# Patient Record
Sex: Male | Born: 1995 | Race: Black or African American | Hispanic: No | Marital: Married | Smoking: Former smoker
Health system: Southern US, Community
[De-identification: ages and names within clinical notes are randomized; demographics above are authoritative.]

## PROBLEM LIST (undated history)

## (undated) DIAGNOSIS — I10 Essential (primary) hypertension: Secondary | ICD-10-CM

## (undated) DIAGNOSIS — E119 Type 2 diabetes mellitus without complications: Secondary | ICD-10-CM

## (undated) HISTORY — PX: BACK SURGERY: SHX140

## (undated) HISTORY — PX: ADENOIDECTOMY: SUR15

---

## 2014-07-04 DIAGNOSIS — E119 Type 2 diabetes mellitus without complications: Secondary | ICD-10-CM | POA: Insufficient documentation

## 2014-07-04 DIAGNOSIS — Z720 Tobacco use: Secondary | ICD-10-CM | POA: Insufficient documentation

## 2014-07-04 DIAGNOSIS — Z794 Long term (current) use of insulin: Secondary | ICD-10-CM

## 2014-07-04 DIAGNOSIS — IMO0002 Reserved for concepts with insufficient information to code with codable children: Secondary | ICD-10-CM | POA: Insufficient documentation

## 2014-07-04 DIAGNOSIS — E1165 Type 2 diabetes mellitus with hyperglycemia: Secondary | ICD-10-CM | POA: Insufficient documentation

## 2014-07-04 DIAGNOSIS — Z6835 Body mass index (BMI) 35.0-35.9, adult: Secondary | ICD-10-CM

## 2015-08-20 LAB — HM DIABETES EYE EXAM

## 2015-10-12 ENCOUNTER — Ambulatory Visit: Payer: Self-pay | Admitting: Internal Medicine

## 2015-10-12 DIAGNOSIS — Z0289 Encounter for other administrative examinations: Secondary | ICD-10-CM

## 2016-01-28 ENCOUNTER — Ambulatory Visit: Payer: Self-pay | Admitting: Primary Care

## 2016-01-28 DIAGNOSIS — Z0289 Encounter for other administrative examinations: Secondary | ICD-10-CM

## 2016-03-27 ENCOUNTER — Encounter (HOSPITAL_COMMUNITY): Payer: Self-pay | Admitting: Emergency Medicine

## 2016-03-27 ENCOUNTER — Emergency Department (HOSPITAL_COMMUNITY)
Admission: EM | Admit: 2016-03-27 | Discharge: 2016-03-27 | Disposition: A | Discharge status: Home / Self Care | Payer: BLUE CROSS/BLUE SHIELD | Attending: Emergency Medicine | Admitting: Emergency Medicine

## 2016-03-27 DIAGNOSIS — F17210 Nicotine dependence, cigarettes, uncomplicated: Secondary | ICD-10-CM | POA: Insufficient documentation

## 2016-03-27 DIAGNOSIS — I10 Essential (primary) hypertension: Secondary | ICD-10-CM | POA: Insufficient documentation

## 2016-03-27 DIAGNOSIS — Z794 Long term (current) use of insulin: Secondary | ICD-10-CM | POA: Insufficient documentation

## 2016-03-27 DIAGNOSIS — E119 Type 2 diabetes mellitus without complications: Secondary | ICD-10-CM | POA: Insufficient documentation

## 2016-03-27 DIAGNOSIS — M5442 Lumbago with sciatica, left side: Secondary | ICD-10-CM | POA: Insufficient documentation

## 2016-03-27 DIAGNOSIS — M545 Low back pain: Secondary | ICD-10-CM | POA: Diagnosis present

## 2016-03-27 HISTORY — DX: Essential (primary) hypertension: I10

## 2016-03-27 HISTORY — DX: Type 2 diabetes mellitus without complications: E11.9

## 2016-03-27 MED ORDER — OXYCODONE-ACETAMINOPHEN 5-325 MG PO TABS
1.0000 | ORAL_TABLET | Freq: Once | ORAL | Status: AC
  Administered 2016-03-27: 1 via ORAL
  Filled 2016-03-27: qty 1

## 2016-03-27 MED ORDER — OXYCODONE-ACETAMINOPHEN 5-325 MG PO TABS: 2.0000 | ORAL_TABLET | ORAL | 0 refills | Status: DC | PRN

## 2016-03-27 MED ORDER — PREGABALIN 75 MG PO CAPS: 75.0000 mg | ORAL_CAPSULE | Freq: Two times a day (BID) | ORAL | 0 refills | Status: DC

## 2016-03-27 NOTE — ED Provider Notes (Signed)
[facility]EMER[facility]ENCY DEPT Provider Note   CSN: [id number] Arrival date & time: 03/27/16  0556     History   Chief Complaint Chief Complaint  Patient presents with  . Back Pain    HPI Cody Ruiz is a 20 y.o. male.  HPI   [age] YOM presents today with complaints of back pain. Pt reports a history of the same resulting in discectomy. PT notes approximately 3 weeks ago he started to develop lower back pain, worse on the right. Pt notes the pain radiates down his left leg with minor associated intermittent tingling in his toes. He reports standing straight or sitting makes symptoms worse. Pt has been seen by [facility], he had an MRI of his lower spine showing nerve root impingement. He has been referred to [facility] [facility] where he'll see Dr. Yetta BarreJones on 04/07/2016 ( 11 days from now). Patient notes he has been taking oxycodone at home which provides very minimal relief, notes that he did receive a sample of Lyrica while at the orthopedic office which significantly improved his symptoms. Patient denies any red flags for back pain, denies any drug use, reports this is similar to previous, pain is worsening, no other concerning signs or symptoms here today.   Past Medical History:  Diagnosis Date  . Diabetes mellitus without complication (HCC)   . Hypertension     There are no active problems to display for this patient.   Past Surgical History:  Procedure Laterality Date  . ADENOIDECTOMY    . BACK SURGERY         Home Medications    Prior to Admission medications   Medication Sig Start Date End Date Taking? Authorizing Provider  acetaminophen (TYLENOL) 500 MG tablet Take 1,500 mg by mouth every 6 (six) hours as needed for mild pain.   Yes Historical Provider, MD  amLODipine-benazepril (LOTREL) 10-40 MG capsule Take 1 capsule by mouth daily.   Yes Historical Provider, MD  carvedilol (COREG) 12.5 MG tablet Take 12.5 mg by mouth 2 (two) times daily with a meal.    Yes Historical Provider, MD  insulin detemir (LEVEMIR) 100 unit/ml SOLN Inject 50 Units into the skin at bedtime.   Yes Historical Provider, MD  insulin lispro (HUMALOG KWIKPEN) 100 UNIT/ML KiwkPen Inject 10 Units into the skin 3 (three) times daily with meals.   Yes Historical Provider, MD  oxyCODONE-acetaminophen (PERCOCET/ROXICET) 5-325 MG tablet Take 2 tablets by mouth every 4 (four) hours as needed for severe pain. 03/27/16   Eyvonne MechanicJeffrey Ajmal Kathan, PA-C  pregabalin (LYRICA) 75 MG capsule Take 1 capsule (75 mg total) by mouth 2 (two) times daily. 03/27/16   Eyvonne MechanicJeffrey Pradeep Beaubrun, PA-C    Family History Family History  Problem Relation Age of Onset  . Hypertension Mother   . Diabetes Mother   . Hypertension Father     Social History Social History  Substance Use Topics  . Smoking status: Current Every Day Smoker    Types: Cigarettes  . Smokeless tobacco: Never Used  . Alcohol use No     Allergies   Patient has no known allergies.   Review of Systems Review of Systems  All other systems reviewed and are negative.    Physical Exam Updated Vital Signs BP (!) 184/108 (BP Location: Right Arm)   Pulse 70   Temp 98 F (36.7 C) (Oral)   Resp 18   Ht 5\' 11"  (1.803 m)   Wt 122.5 kg   SpO2 97%   BMI 37.66 kg/m  Physical Exam  Constitutional: He is oriented to person, place, and time. He appears well-developed and well-nourished. No distress.  HENT:  Head: Normocephalic and atraumatic.  Eyes: Conjunctivae are normal. Pupils are equal, round, and reactive to light. Right eye exhibits no discharge. Left eye exhibits no discharge. No scleral icterus.  Neck: Normal range of motion. Neck supple. No JVD present. No tracheal deviation present.  Pulmonary/Chest: Effort normal. No stridor.  Musculoskeletal: Normal range of motion. He exhibits tenderness. He exhibits no edema.  No C, T, or L spine tenderness to palpation. No obvious signs of trauma, deformity, infection, step-offs. Previous  midline lumbar surgical scare noted. Lung expansion normal. No scoliosis or kyphosis. Bilateral lower extremity strength 5 out of 5, sensation grossly intact, patellar reflexes absent ( normal per patient)   Straight leg postive Ambulates without difficulty    Neurological: He is alert and oriented to person, place, and time. Coordination normal.  Skin: Skin is warm and dry. He is not diaphoretic.  Psychiatric: He has a normal mood and affect. His behavior is normal. Judgment and thought content normal.  Nursing note and vitals reviewed.    ED Treatments / Results  Labs (all labs ordered are listed, but only abnormal results are displayed) Labs Reviewed - No data to display  EKG  EKG Interpretation None       Radiology No results found.  Procedures Procedures (including critical care time)  Medications Ordered in ED Medications  oxyCODONE-acetaminophen (PERCOCET/ROXICET) 5-325 MG per tablet 1 tablet (not administered)     Initial Impression / Assessment and Plan / ED Course  I have reviewed the triage vital signs and the nursing notes.  Pertinent labs & imaging results that were available during my care of the patient were reviewed by me and considered in my medical decision making (see chart for details).  Clinical Course      Final Clinical Impressions(s) / ED Diagnoses   Final diagnoses:  Acute right-sided low back pain with left-sided sciatica   Labs:  Imaging:  Consults:  Therapeutics:  Discharge Meds: percocet, lyrica   Assessment/Plan:  20 year old male presents today with back pain. Patient has recent MRI, closely followed by orthopedics. He has no significant change in his back pain, does have slightly worsening pain.(Similar to previous back pain, he has an appointment with neurosurgery. He has no red flags, no need for repeat imaging here in the ED. Patient reports he has taken Lyrica and notes this this is the most relieving medication he has  been given. Truck reporting database reviewed, patient has had short course of narcotics recently, he was forthcoming with this information. I will give patient short course of Percocet, Lyrica, and encouraged him to follow up with neurosurgery soon as possible, or return to orthopedics for reassessment. Patient is given strict return precautions, both him and his wife verbalized understanding and agreement to today's plan had no further questions or concerns.      New Prescriptions New Prescriptions   OXYCODONE-ACETAMINOPHEN (PERCOCET/ROXICET) 5-325 MG TABLET    Take 2 tablets by mouth every 4 (four) hours as needed for severe pain.   PREGABALIN (LYRICA) 75 MG CAPSULE    Take 1 capsule (75 mg total) by mouth 2 (two) times daily.     Eyvonne MechanicJeffrey Amunique Neyra, PA-C 03/27/16 [id number]    Dione Boozeavid Glick, MD 03/27/16 2251

## 2016-03-27 NOTE — ED Triage Notes (Signed)
Pt come from home stating he is having increasing pain in his low back an din to the right leg. Pt states he his currently scheduled to have an appointment with a neurosurgeon on 12/18.

## 2016-03-27 NOTE — Discharge Instructions (Signed)
Please read attached information. If you experience any new or worsening signs or symptoms please return to the emergency room for evaluation. Please follow-up with your primary care provider or specialist as discussed. Please use medication prescribed only as directed and discontinue taking if you have any concerning signs or symptoms.   °

## 2016-03-31 ENCOUNTER — Other Ambulatory Visit: Payer: Self-pay | Admitting: Neurological Surgery

## 2016-03-31 DIAGNOSIS — M5126 Other intervertebral disc displacement, lumbar region: Secondary | ICD-10-CM

## 2016-04-07 ENCOUNTER — Ambulatory Visit
Admission: RE | Admit: 2016-04-07 | Discharge: 2016-04-07 | Disposition: A | Discharge status: Home / Self Care | Payer: BLUE CROSS/BLUE SHIELD | Source: Ambulatory Visit | Attending: Neurological Surgery | Admitting: Neurological Surgery

## 2016-04-07 ENCOUNTER — Other Ambulatory Visit: Payer: Self-pay | Admitting: Neurological Surgery

## 2016-04-07 DIAGNOSIS — M5126 Other intervertebral disc displacement, lumbar region: Secondary | ICD-10-CM

## 2016-04-07 MED ORDER — IOPAMIDOL (ISOVUE-M 200) INJECTION 41%
1.0000 mL | Freq: Once | INTRAMUSCULAR | Status: AC
  Administered 2016-04-07: 1 mL via EPIDURAL

## 2016-04-07 MED ORDER — METHYLPREDNISOLONE ACETATE 40 MG/ML INJ SUSP (RADIOLOG
120.0000 mg | Freq: Once | INTRAMUSCULAR | Status: AC
  Administered 2016-04-07: 120 mg via EPIDURAL

## 2016-04-07 NOTE — Discharge Instructions (Signed)

## 2016-04-09 ENCOUNTER — Other Ambulatory Visit: Payer: BLUE CROSS/BLUE SHIELD

## 2016-05-13 ENCOUNTER — Encounter: Payer: Self-pay | Admitting: Internal Medicine

## 2016-05-13 ENCOUNTER — Ambulatory Visit (INDEPENDENT_AMBULATORY_CARE_PROVIDER_SITE_OTHER): Payer: 59 | Admitting: Internal Medicine

## 2016-05-13 VITALS — BP 158/100 | HR 87 | Temp 98.6°F | Ht 70.5 in | Wt 257.5 lb

## 2016-05-13 DIAGNOSIS — E1065 Type 1 diabetes mellitus with hyperglycemia: Secondary | ICD-10-CM | POA: Insufficient documentation

## 2016-05-13 DIAGNOSIS — Z9189 Other specified personal risk factors, not elsewhere classified: Secondary | ICD-10-CM

## 2016-05-13 DIAGNOSIS — G8929 Other chronic pain: Secondary | ICD-10-CM | POA: Diagnosis not present

## 2016-05-13 DIAGNOSIS — I10 Essential (primary) hypertension: Secondary | ICD-10-CM

## 2016-05-13 DIAGNOSIS — R41840 Attention and concentration deficit: Secondary | ICD-10-CM | POA: Diagnosis not present

## 2016-05-13 DIAGNOSIS — M5441 Lumbago with sciatica, right side: Secondary | ICD-10-CM | POA: Diagnosis not present

## 2016-05-13 LAB — COMPREHENSIVE METABOLIC PANEL
ALBUMIN: 4.4 g/dL (ref 3.5–5.2)
ALK PHOS: 100 U/L (ref 39–117)
ALT: 31 U/L (ref 0–53)
AST: 21 U/L (ref 0–37)
BILIRUBIN TOTAL: 0.4 mg/dL (ref 0.2–1.2)
BUN: 13 mg/dL (ref 6–23)
CO2: 32 mEq/L (ref 19–32)
CREATININE: 0.94 mg/dL (ref 0.40–1.50)
Calcium: 9.8 mg/dL (ref 8.4–10.5)
Chloride: 101 mEq/L (ref 96–112)
GFR: 128.11 mL/min (ref >60.00)
Glucose, Bld: 156 mg/dL — ABNORMAL HIGH (ref 70–99)
Potassium: 4.2 mEq/L (ref 3.5–5.1)
Sodium: 136 mEq/L (ref 135–145)
TOTAL PROTEIN: 8 g/dL (ref 6.0–8.3)

## 2016-05-13 LAB — LIPID PANEL
CHOLESTEROL: 206 mg/dL — AB (ref 0–200)
HDL: 43.2 mg/dL (ref >39.00)
NONHDL: 162.89
Total CHOL/HDL Ratio: 5
Triglycerides: 217 mg/dL — ABNORMAL HIGH (ref 0.0–149.0)
VLDL: 43.4 mg/dL — ABNORMAL HIGH (ref 0.0–40.0)

## 2016-05-13 LAB — CBC
HCT: 41.8 % (ref 39.0–52.0)
HEMOGLOBIN: 13.6 g/dL (ref 13.0–17.0)
MCHC: 32.4 g/dL (ref 30.0–36.0)
MCV: 85.2 fl (ref 78.0–100.0)
PLATELETS: 364 10*3/uL (ref 150.0–400.0)
RBC: 4.9 Mil/uL (ref 4.22–5.81)
RDW: 15.2 % (ref 11.5–15.5)
WBC: 13.4 10*3/uL — AB (ref 4.0–10.5)

## 2016-05-13 LAB — LDL CHOLESTEROL, DIRECT: LDL DIRECT: 110 mg/dL

## 2016-05-13 LAB — HEMOGLOBIN A1C: Hgb A1c MFr Bld: 10.2 % — ABNORMAL HIGH (ref 4.6–6.5)

## 2016-05-13 NOTE — Assessment & Plan Note (Signed)
Uncontrolled CMET today If kidney function normal, will refill Amlodipine-Benazepril Will hold off on Coreg at this time  RTC in 3 weeks for follow up HTN

## 2016-05-13 NOTE — Assessment & Plan Note (Signed)
He will continue Meloxicam, Robaxin and Gabapentin He will continue to follow with neurosurgery

## 2016-05-13 NOTE — Assessment & Plan Note (Signed)
Uncontrolled A1C, CMET and Lipid profile today Encouraged him to consume a low carb, low fat diet and exercise for weight loss Will send him in a meter, strips and lancets After labs are back, will restart Levemir and Humalog Foot exam today Encouraged him to continue yearly eye exams He declines flu and pneumovax

## 2016-05-13 NOTE — Patient Instructions (Signed)
Diabetes Mellitus and Food It is important for you to manage your blood sugar (glucose) level. Your blood glucose level can be greatly affected by what you eat. Eating healthier foods in the appropriate amounts throughout the day at about the same time each day will help you control your blood glucose level. It can also help slow or prevent worsening of your diabetes mellitus. Healthy eating may even help you improve the level of your blood pressure and reach or maintain a healthy weight. General recommendations for healthful eating and cooking habits include:  Eating meals and snacks regularly. Avoid going long periods of time without eating to lose weight.  Eating a diet that consists mainly of plant-based foods, such as fruits, vegetables, nuts, legumes, and whole grains.  Using low-heat cooking methods, such as baking, instead of high-heat cooking methods, such as deep frying.  Work with your dietitian to make sure you understand how to use the Nutrition Facts information on food labels. How can food affect me? Carbohydrates Carbohydrates affect your blood glucose level more than any other type of food. Your dietitian will help you determine how many carbohydrates to eat at each meal and teach you how to count carbohydrates. Counting carbohydrates is important to keep your blood glucose at a healthy level, especially if you are using insulin or taking certain medicines for diabetes mellitus. Alcohol Alcohol can cause sudden decreases in blood glucose (hypoglycemia), especially if you use insulin or take certain medicines for diabetes mellitus. Hypoglycemia can be a life-threatening condition. Symptoms of hypoglycemia (sleepiness, dizziness, and disorientation) are similar to symptoms of having too much alcohol. If your health care provider has given you approval to drink alcohol, do so in moderation and use the following guidelines:  Women should not have more than one drink per day, and men  should not have more than two drinks per day. One drink is equal to: ? 12 oz of beer. ? 5 oz of wine. ? 1 oz of hard liquor.  Do not drink on an empty stomach.  Keep yourself hydrated. Have water, diet soda, or unsweetened iced tea.  Regular soda, juice, and other mixers might contain a lot of carbohydrates and should be counted.  What foods are not recommended? As you make food choices, it is important to remember that all foods are not the same. Some foods have fewer nutrients per serving than other foods, even though they might have the same number of calories or carbohydrates. It is difficult to get your body what it needs when you eat foods with fewer nutrients. Examples of foods that you should avoid that are high in calories and carbohydrates but low in nutrients include:  Trans fats (most processed foods list trans fats on the Nutrition Facts label).  Regular soda.  Juice.  Candy.  Sweets, such as cake, pie, doughnuts, and cookies.  Fried foods.  What foods can I eat? Eat nutrient-rich foods, which will nourish your body and keep you healthy. The food you should eat also will depend on several factors, including:  The calories you need.  The medicines you take.  Your weight.  Your blood glucose level.  Your blood pressure level.  Your cholesterol level.  You should eat a variety of foods, including:  Protein. ? Lean cuts of meat. ? Proteins low in saturated fats, such as fish, egg whites, and beans. Avoid processed meats.  Fruits and vegetables. ? Fruits and vegetables that may help control blood glucose levels, such as apples,   mangoes, and yams.  Dairy products. ? Choose fat-free or low-fat dairy products, such as milk, yogurt, and cheese.  Grains, bread, pasta, and rice. ? Choose whole grain products, such as multigrain bread, whole oats, and brown rice. These foods may help control blood pressure.  Fats. ? Foods containing healthful fats, such as  nuts, avocado, olive oil, canola oil, and fish.  Does everyone with diabetes mellitus have the same meal plan? Because every person with diabetes mellitus is different, there is not one meal plan that works for everyone. It is very important that you meet with a dietitian who will help you create a meal plan that is just right for you. This information is not intended to replace advice given to you by your health care provider. Make sure you discuss any questions you have with your health care provider. Document Released: 01/02/2005 Document Revised: 09/13/2015 Document Reviewed: 03/04/2013 Elsevier Interactive Patient Education  2017 [employer] Inc.  

## 2016-05-13 NOTE — Progress Notes (Signed)
HPI  Pt presents to the clinic today to establish care and for management of the conditions listed below. He is [place] from [place] in [place].  HTN: His BP today is 158/100. He was prescribed Amlodipine-Benazapril and Carvedilol in the past and reports that controlled his blood pressure. He has been out of his medications for a few months. He denies headaches, dizziness, chest pain or shortness of breath.   DM, type 1: He is not testing his sugars. He is currently not taking any insulin at this time. He was on Humalog and Levemir. He does not check his feet but he does get yearly eye exams. He declines flu and pneumonia vaccines.  Low Back Pain: This started 3 months ago, re injured an old back injury. He reports he already had had back surgery x 1 for a herniated disk. He describes the pain as constant achy pain, but can be sharp and stabbing with certain movements. He reports tingling down his right leg. He denies loss of bowel or bladder. He is taking Meloxicam, Neurontin and Robaxin with some relief. He is following with Neurosurgery.   He is also requesting referral to psychiatry at this time. He reports he lost his only son about 4 months ago. He denies anxiety or depression but feels like he is not grieving like he should. He is also concerned that he may have ADHD. He reports he can not remember anything. He has trouble focusing and no motivation to get things done. He has never been diagnosed with ADHD in the past.  Flu: never Tetanus: > 10 years ago Pneumovax: never Vision Screening: annually, 08/2015 Dentist: biannually  Past Medical History:  Diagnosis Date  . Diabetes mellitus without complication (HCC)   . Hypertension     Current Outpatient Prescriptions  Medication Sig Dispense Refill  . gabapentin (NEURONTIN) 300 MG capsule Take 600 mg by mouth 3 (three) times daily.     . meloxicam (MOBIC) 15 MG tablet     . methocarbamol (ROBAXIN) 750 MG tablet Take  750 mg by mouth 3 (three) times daily.     Marland Kitchen. acetaminophen (TYLENOL) 500 MG tablet Take 1,500 mg by mouth every 6 (six) hours as needed for mild pain.    Marland Kitchen. amLODipine-benazepril (LOTREL) 10-40 MG capsule Take 1 capsule by mouth daily.    . carvedilol (COREG) 12.5 MG tablet Take 12.5 mg by mouth 2 (two) times daily with a meal.    . insulin detemir (LEVEMIR) 100 unit/ml SOLN Inject 50 Units into the skin at bedtime.    . insulin lispro (HUMALOG KWIKPEN) 100 UNIT/ML KiwkPen Inject 10 Units into the skin 3 (three) times daily with meals.     No current facility-administered medications for this visit.     No Known Allergies  Family History  Problem Relation Age of Onset  . Hypertension Mother   . Diabetes Mother   . Hypertension Father   . Diabetes Paternal Grandmother     Social History   Social History  . Marital status: Married    Spouse name: N/A  . Number of children: N/A  . Years of education: N/A   Occupational History  . Not on file.   Social History Main Topics  . Smoking status: Current Every Day Smoker    Types: Cigars  . Smokeless tobacco: Never Used     Comment: cigar-1 per day  . Alcohol use No  . Drug use: Yes    Frequency: 7.0 times  per week    Types: Marijuana  . Sexual activity: Yes   Other Topics Concern  . Not on file   Social History Narrative  . No narrative on file    ROS:  Constitutional: Denies fever, malaise, fatigue, headache or abrupt weight changes.  HEENT: Denies eye pain, eye redness, ear pain, ringing in the ears, wax buildup, runny nose, nasal congestion, bloody nose, or sore throat. Respiratory: Denies difficulty breathing, shortness of breath, cough or sputum production.   Cardiovascular: Denies chest pain, chest tightness, palpitations or swelling in the hands or feet.  Gastrointestinal: Denies abdominal pain, bloating, constipation, diarrhea or blood in the stool.  GU: Denies frequency, urgency, pain with urination, blood in  urine, odor or discharge. Musculoskeletal: Pt reports back pain. Denies decrease in range of motion, difficulty with gait, or joint swelling.  Skin: Denies redness, rashes, lesions or ulcercations.  Neurological: Pt reports tingling in right leg. Denies dizziness, difficulty with speech or problems with balance and coordination.  Psych: Pt reports stress. Denies anxiety, depression, SI/HI.  No other specific complaints in a complete review of systems (except as listed in HPI above).  PE:  BP (!) 158/100   Pulse 87   Temp 98.6 F (37 C) (Oral)   Ht 5' 10.5" (1.791 m)   Wt 257 lb 8 oz (116.8 kg)   SpO2 98%   BMI 36.43 kg/m  Wt Readings from Last 3 Encounters:  05/13/16 257 lb 8 oz (116.8 kg)  03/27/16 270 lb (122.5 kg)    General: Appears his stated age, obese in NAD. Skin: No ulcerations noted. HEENT: Head: normal shape and size; Eyes: sclera white, no icterus, conjunctiva pink, PERRLA and EOMs intact;  Cardiovascular: Normal rate and rhythm. S1,S2 noted.  No murmur, rubs or gallops noted.  Pulmonary/Chest: Normal effort and positive vesicular breath sounds. No respiratory distress. No wheezes, rales or ronchi noted.  Musculoskeletal: Pain with palpation over the lumbar spine. Strength 5/5 BUE/BLE. No difficulty with gait.  Neurological: Alert and oriented. Sensation intact to BLE. Psychiatric: Mood and affect normal. Behavior is normal. Judgment and thought content normal.   Assessment and Plan:  Grief after loss of son:  Advised him that he needs psychology for grief counseling not psychiatry He insists that he wants to see a psychiatrist- referral placed.  Inattention, trouble focusing, lack of motivation:  He is requesting ADHD testing Referral placed to psychiatry  RTC in 3 weeks to follow up HTN Robyn Galati, NP

## 2016-05-14 ENCOUNTER — Other Ambulatory Visit: Payer: Self-pay | Admitting: Internal Medicine

## 2016-05-14 MED ORDER — AMLODIPINE BESY-BENAZEPRIL HCL 10-40 MG PO CAPS: 1.0000 | ORAL_CAPSULE | Freq: Every day | ORAL | 0 refills | Status: DC

## 2016-05-14 MED ORDER — INSULIN LISPRO 100 UNIT/ML (KWIKPEN): 10.0000 [IU] | PEN_INJECTOR | Freq: Three times a day (TID) | SUBCUTANEOUS | 0 refills | Status: DC

## 2016-05-14 MED ORDER — ATORVASTATIN CALCIUM 10 MG PO TABS: 10.0000 mg | ORAL_TABLET | Freq: Every day | ORAL | 2 refills | Status: DC

## 2016-05-14 MED ORDER — LANCETS 30G MISC: 1.0000 | Freq: Three times a day (TID) | 2 refills | Status: DC

## 2016-05-14 MED ORDER — INSULIN DETEMIR 100 UNIT/ML FLEXPEN: 25.0000 [IU] | PEN_INJECTOR | Freq: Every day | SUBCUTANEOUS | 0 refills | Status: DC

## 2016-05-14 NOTE — Addendum Note (Signed)
Addended by: Roena MaladyEVONTENNO, Wynston Romey Y on: 05/14/2016 05:20 PM   Modules accepted: Orders

## 2016-05-16 ENCOUNTER — Other Ambulatory Visit: Payer: Self-pay

## 2016-05-16 MED ORDER — INSULIN ASPART 100 UNIT/ML FLEXPEN: 10.0000 [IU] | PEN_INJECTOR | Freq: Three times a day (TID) | SUBCUTANEOUS | 0 refills | Status: AC

## 2016-05-16 NOTE — Telephone Encounter (Signed)
Per [employer] insurance will not cover Humalog must change to [employer]---Regina Baity gave verbal ok to change---Novolog sent to pharmacy

## 2016-05-20 MED ORDER — EASY TOUCH LANCETS 30G MISC: 1.0000 | Freq: Three times a day (TID) | 2 refills | Status: DC

## 2016-05-20 NOTE — Addendum Note (Signed)
Addended by: Roena MaladyEVONTENNO, Kasmira Cacioppo Y on: 05/20/2016 04:22 PM   Modules accepted: Orders

## 2016-06-05 ENCOUNTER — Telehealth: Payer: Self-pay | Admitting: Internal Medicine

## 2016-06-05 ENCOUNTER — Ambulatory Visit (HOSPITAL_COMMUNITY): Payer: 59 | Admitting: Psychiatry

## 2016-06-05 ENCOUNTER — Ambulatory Visit: Payer: 59 | Admitting: Internal Medicine

## 2016-06-05 NOTE — Progress Notes (Deleted)
Subjective:    Patient ID: Cody Ruiz, male    DOB: [birth year], 21 y.o.   MRN: [id number]  HPI  Pt presents to the clinic today for 3 week follow up of HTN. His BP at his last visit was 158/100. He was restarted on Amlodipine-Benazapril 10-40 mg. He has been taking the medication as prescribed. His BP today is. There is no ECG on file.  Review of Systems      Past Medical History:  Diagnosis Date  . Diabetes mellitus without complication (HCC)   . Hypertension     Current Outpatient Prescriptions  Medication Sig Dispense Refill  . acetaminophen (TYLENOL) 500 MG tablet Take 1,500 mg by mouth every 6 (six) hours as needed for mild pain.    Marland Kitchen. amLODipine-benazepril (LOTREL) 10-40 MG capsule Take 1 capsule by mouth daily. 30 capsule 0  . atorvastatin (LIPITOR) 10 MG tablet Take 1 tablet (10 mg total) by mouth daily. 30 tablet 2  . carvedilol (COREG) 12.5 MG tablet Take 12.5 mg by mouth 2 (two) times daily with a meal.    . EASY TOUCH LANCETS 30G MISC 1 each by Does not apply route 3 (three) times daily. 300 each 2  . gabapentin (NEURONTIN) 300 MG capsule Take 600 mg by mouth 3 (three) times daily.     . insulin aspart (NOVOLOG) 100 UNIT/ML FlexPen Inject 10 Units into the skin 3 (three) times daily with meals. 15 mL 0  . Insulin Detemir (LEVEMIR) 100 UNIT/ML Pen Inject 25 Units into the skin daily at 10 pm. 15 mL 0  . insulin detemir (LEVEMIR) 100 unit/ml SOLN Inject 50 Units into the skin at bedtime.    . meloxicam (MOBIC) 15 MG tablet     . methocarbamol (ROBAXIN) 750 MG tablet Take 750 mg by mouth 3 (three) times daily.      No current facility-administered medications for this visit.     No Known Allergies  Family History  Problem Relation Age of Onset  . Hypertension Mother   . Diabetes Mother   . Hypertension Father   . Diabetes Paternal Grandmother     Social History   Social History  . Marital status: Married    Spouse name: N/A  . Number of children: N/A    . Years of education: N/A   Occupational History  . Not on file.   Social History Main Topics  . Smoking status: Current Every Day Smoker    Types: Cigars  . Smokeless tobacco: Never Used     Comment: cigar-1 per day  . Alcohol use No  . Drug use: Yes    Frequency: 7.0 times per week    Types: Marijuana  . Sexual activity: Yes   Other Topics Concern  . Not on file   Social History Narrative  . No narrative on file     Constitutional: Denies fever, malaise, fatigue, headache or abrupt weight changes.  HEENT: Denies eye pain, eye redness, ear pain, ringing in the ears, wax buildup, runny nose, nasal congestion, bloody nose, or sore throat. Respiratory: Denies difficulty breathing, shortness of breath, cough or sputum production.   Cardiovascular: Denies chest pain, chest tightness, palpitations or swelling in the hands or feet.  Gastrointestinal: Denies abdominal pain, bloating, constipation, diarrhea or blood in the stool.  GU: Denies urgency, frequency, pain with urination, burning sensation, blood in urine, odor or discharge. Musculoskeletal: Denies decrease in range of motion, difficulty with gait, muscle pain or joint pain and swelling.  Skin: Denies redness, rashes, lesions or ulcercations.  Neurological: Denies dizziness, difficulty with memory, difficulty with speech or problems with balance and coordination.  Psych: Denies anxiety, depression, SI/HI.  No other specific complaints in a complete review of systems (except as listed in HPI above).  Objective:   Physical Exam        Assessment & Plan:

## 2016-06-05 NOTE — Telephone Encounter (Signed)
Patient did not come in for their appointment today for 3 week bp follow up Please let me know if patient needs to be contacted immediately for follow up or no follow up needed.

## 2016-06-06 NOTE — Telephone Encounter (Signed)
Yes he needs to follow up ASAP

## 2016-06-10 ENCOUNTER — Encounter (HOSPITAL_COMMUNITY): Payer: Self-pay | Admitting: Psychiatry

## 2016-06-10 ENCOUNTER — Ambulatory Visit (INDEPENDENT_AMBULATORY_CARE_PROVIDER_SITE_OTHER): Payer: 59 | Admitting: Psychiatry

## 2016-06-10 VITALS — BP 130/70 | HR 68 | Ht 71.0 in | Wt 260.4 lb

## 2016-06-10 DIAGNOSIS — F1290 Cannabis use, unspecified, uncomplicated: Secondary | ICD-10-CM

## 2016-06-10 DIAGNOSIS — F17210 Nicotine dependence, cigarettes, uncomplicated: Secondary | ICD-10-CM

## 2016-06-10 DIAGNOSIS — Z794 Long term (current) use of insulin: Secondary | ICD-10-CM | POA: Diagnosis not present

## 2016-06-10 DIAGNOSIS — F331 Major depressive disorder, recurrent, moderate: Secondary | ICD-10-CM | POA: Diagnosis not present

## 2016-06-10 DIAGNOSIS — Z79899 Other long term (current) drug therapy: Secondary | ICD-10-CM

## 2016-06-10 MED ORDER — FLUOXETINE HCL 10 MG PO CAPS: 10.0000 mg | ORAL_CAPSULE | Freq: Every day | ORAL | 1 refills | Status: DC

## 2016-06-10 NOTE — Progress Notes (Signed)
Psychiatric Initial Adult Assessment   Patient Identification: Cody Ruiz MRN:  [id number] Date of Evaluation:  06/10/2016 Referral Source: self Chief Complaint:  depression Visit Diagnosis:    ICD-9-CM ICD-10-CM   1. Moderate episode of recurrent major depressive disorder (HCC) 296.32 F33.1 FLUoxetine (PROZAC) 10 MG capsule    History of Present Illness:  Patient is a 21 year old male with no significant psychiatric history, with medical history of type 1 diabetes, who presents today for psychiatric intake assessment. He shares that he has never seen a psychiatrist and is only briefly spoken with counselors and history.  He shares that he and his wife have been married for about a year, and last year lost their son when he was 742 weeks old. He died from trisomy 5522. Their son was able to be with them for about a week and a half at home, and he shares that his family and his wife's family were all there to make sure that their son was loved 100% of his life. He is choked up and tearful in describing the events.  He shares that they have had substantial grief and he has been raised to avoid his feelings much of his life, so it has been difficult for him to be able to think about and talk about his grief. He shares that he thinks about his son every day, and it "tears me apart". He denies any thoughts about death or dying, and wants to be able to get back to having his usual good cheerful mood, so that he can be a good husband. 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 He has a strong love of football and played sports throughout high school.  He shares that he has felt quite down and depressed, has trouble sleeping, is not interested in the things he used to like, has low energy, difficulty focusing, feels forgetful, has a poor appetite, and feels quite irritable with things that usually would not bother him.  He shares that before this  past year, he was generally cheerful and happy go lucky guy. He denies any significant history of mania or psychosis. He shares that he did struggle with a couple periods of depression in his late teenage years, and this was in the context of having a significant back injury which made him no longer able to play football, and then the subsequent surgery which was quite painful and had a lengthy recovery.  He denies any alcohol use. He does share that he smokes marijuana daily, and realizes that he has definitely been using more sense his son passed away. He does not want to use this as a "crutch" and wishes to receive medication treatment for his depression, so that he can feel better and cut down to discontinue his marijuana use. He names that marijuana is not only illegal, but he knows that it would get in the way of him being able to join school and be able to have enough focus and attention to do well.  We spent time discussing initiation of fluoxetine, the pathophysiology of depression and mechanism of SSRI, and the risks and benefits associated with fluoxetine, including the black box warning. Discussed a starting dose of 10 mg and increased to 20 mg in 3 days as tolerated. Patient is on board with this and agrees to follow up in 2 weeks.  Fill Date Product, Str, Form Qty Days Pt ID Prescriber Written RX# N/R* Pharm **  MED+ ---------- -------------------------------- ------ ---- --------- ---------- ---------- ------------ ----- --------- ------ 03/27/2016 LYRICA 75 MG CAPSULE 30.00 30 [id number] 03/27/2016 [id number] N [id number] 00.0 03/27/2016 OXYCODONE-ACETAMINOPHEN 5-325 6.00 1 [id number] 03/27/2016 [id number] N [id number] 45.0 03/21/2016 OXYCODONE-ACETAMINOPHEN 5-325 30.00 7 [id number] 03/21/2016 [id number] N [id number] 32.14 03/04/2016 TRAMADOL HCL 50 MG TABLET 40.00 10 [id number] 03/04/2016 [id number] N [id number] 20.0 02/27/2016 OXYCODONE-ACETAMINOPHEN 10-325 20.00 7  [id number] 02/27/2016 [id number] N [id number] 42.86 *N/R N=New R=Refill +MED Daily Prescribers for prescriptions listed ---------------------------------------------------------------------------------------------------------------------------------- [id number] Estell Harpin MD; MURPHY/WAINER Lucius Conn, [facility] [address] [place], [place] [address] [id number] Albertha Ghee S MD; [address], [place] [address] [id number] HEDGES, JEFFREY T (PA); [facility], [address] [address], [place] [address] Pharmacies that dispensed prescriptions listed ---------------------------------------------------------------------------------------------------------------------------------- [id number] [facility] DBA: [facility] [id number], [address] [place] [address], [id number]  [facility] DBA: [facility] [id number], [address] [place] [address], Patients that match search criteria ---------------------------------------------------------------------------------------------------------------------------------- [id number] [facility] Phill, DOB [birth year]; [address], [place] [address] [id number] Riera Penn, DOB [birth year]; [address], [place] [address] MED Summary This section displays cumulative MED values by unique recipient. The MED Max value is the maximum occurrence of cumulative MED sustained for any 3 consecutive days. This value is calculated based on prescriptions dispensed during the date range requested. ----------------------------------------------------------------------------------------------------------------------------------- 42.86 Renninger, Adrian; 1995/07/14; [address], [place] [address]  Associated Signs/Symptoms: Depression Symptoms:  depressed mood, anhedonia, insomnia, psychomotor  retardation, fatigue, feelings of worthlessness/guilt, difficulty concentrating, impaired memory, (Hypo) Manic Symptoms:  none Anxiety Symptoms:  none Psychotic Symptoms:  none PTSD Symptoms: Negative  Past Psychiatric History: No past hospitalizations or psychiatric treatment  Previous Psychotropic Medications: No   Substance Abuse History in the last 12 months:  Yes.    Consequences of Substance Abuse: Recognizes this has negatively affected mood, and may impair schooling abilities  Past Medical History:  Past Medical History:  Diagnosis Date  . Diabetes mellitus without complication (HCC)   . Hypertension     Past Surgical History:  Procedure Laterality Date  . ADENOIDECTOMY    . BACK SURGERY      Family Psychiatric History: Maternal history of depression  Family History:  Family History  Problem Relation Age of Onset  . Hypertension Mother   . Diabetes Mother   . Hypertension Father   . Diabetes Paternal Grandmother     Social History:   Social History   Social History  . Marital status: Married    Spouse name: N/A  . Number of children: N/A  . Years of education: N/A   Social History Main Topics  . Smoking status: Current Every Day Smoker    Types: Cigars  . Smokeless tobacco: Never Used     Comment: cigar-1 per day  . Alcohol use No  . Drug use: Yes    Frequency: 7.0 times per week    Types: Marijuana  . Sexual activity: Yes   Other Topics Concern  . Not on file   Social History Narrative  . No narrative on file    Additional Social History: married for nearly 1 year. Has 2 dogs. Him and his wife plan to try to have a baby again.  Allergies:  No Known Allergies  Metabolic Disorder Labs: Lab Results  Component Value Date   HGBA1C 10.2 (H) 05/13/2016   No results found for: PROLACTIN  Lab Results  Component Value Date   CHOL 206 (H) 05/13/2016   TRIG 217.0 (H) 05/13/2016   HDL 43.20 05/13/2016   CHOLHDL 5 05/13/2016   VLDL 43.4  (H) 05/13/2016     Current Medications: Current Outpatient Prescriptions  Medication Sig Dispense Refill  . acetaminophen (TYLENOL) 500 MG tablet Take 1,500 mg by mouth every 6 (six) hours as needed for mild pain.    Marland Kitchen amLODipine-benazepril (LOTREL) 10-40 MG capsule Take 1 capsule by mouth daily. 30 capsule 0  . atorvastatin (LIPITOR) 10 MG tablet Take 1 tablet (10 mg total) by mouth daily. 30 tablet 2  . carvedilol (COREG) 12.5 MG tablet Take 12.5 mg by mouth 2 (two) times daily with a meal.    . EASY TOUCH LANCETS 30G MISC 1 each by Does not apply route 3 (three) times daily. 300 each 2  . FLUoxetine (PROZAC) 10 MG capsule Take 1 capsule (10 mg total) by mouth daily. Take 10 mg daily for 3 days, then increase to 20 mg daily 60 capsule 1  . gabapentin (NEURONTIN) 300 MG capsule Take 600 mg by mouth 3 (three) times daily.     . insulin aspart (NOVOLOG) 100 UNIT/ML FlexPen Inject 10 Units into the skin 3 (three) times daily with meals. 15 mL 0  . Insulin Detemir (LEVEMIR) 100 UNIT/ML Pen Inject 25 Units into the skin daily at 10 pm. 15 mL 0  . insulin detemir (LEVEMIR) 100 unit/ml SOLN Inject 50 Units into the skin at bedtime.    . meloxicam (MOBIC) 15 MG tablet     . methocarbamol (ROBAXIN) 750 MG tablet Take 750 mg by mouth 3 (three) times daily.      No current facility-administered medications for this visit.     Neurologic: Headache: Negative Seizure: Negative Paresthesias:Negative  Musculoskeletal: Strength & Muscle Tone: within normal limits Gait & Station: normal Patient leans: N/A  Psychiatric Specialty Exam: ROS  There were no vitals taken for this visit.There is no height or weight on file to calculate BMI.  General Appearance: Casual and Well Groomed  Eye Contact:  Fair  Speech:  Clear and Coherent  Volume:  Normal  Mood:  Depressed  Affect:  Congruent, Depressed and Tearful  Thought Process:  Coherent and Goal Directed  Orientation:  Full (Time, Place, and  Person)  Thought Content:  Logical  Suicidal Thoughts:  No  Homicidal Thoughts:  No  Memory:  Immediate;   Good  Judgement:  Good  Insight:  Good  Psychomotor Activity:  Normal  Concentration:  Concentration: Good and Attention Span: Good  Recall:  NA  Fund of Knowledge:Good  Language: Good  Akathisia:  Negative  Handed:  Right  AIMS (if indicated):  n/a  Assets:  Communication Skills Desire for Improvement Financial Resources/Insurance Housing Intimacy Leisure Time Resilience Social Support Talents/Skills Transportation Vocational/Educational  ADL's:  Intact  Cognition: WNL  Sleep:  2 hours off and on throughout the night, generally 6-8 hours total, and naps in the day    Treatment Plan Summary: Mr. Favian Kittleson is a 21 year old man with a adequate history of uncontrolled type 1 diabetes (late adolescent onset) who presents today for a psychiatric intake. On assessment he presents with significant depressive symptoms in the context of the death of his 47-week-old child last year. He has struggled with substantial grief and feels that his depressed mood has negatively affected his relationship with his wife, his work function, and his sense of self.  Spent today learning  about the patient's upbringing, and his generally avoidant "tough it out" mentality that he learned through childhood. Spent time with the patient grieving this significant loss. We discussed his history concerning for major depressive disorder, and reviewed the pathophysiology of MDD, and the medication treatment options. He does not present with any acute safety issues.  # MDD, griefl - Initiate Fluoxetine 10 mg x 3 days, then increase to 20 mg daily - RTC in 2 weeks - Will continue to provide support and discuss additional supports for grief - Patient intends to reach out to the hospice services that took care of his son, as they offer free grief counseling for him and his wife - Will continue to  encourage self-care and behavioral activation  Burnard Leigh, MD 2/20/201810:53 AM

## 2016-06-20 ENCOUNTER — Encounter: Payer: Self-pay | Admitting: Internal Medicine

## 2016-06-20 NOTE — Telephone Encounter (Signed)
Sent letter to patient.

## 2016-06-24 ENCOUNTER — Ambulatory Visit (HOSPITAL_COMMUNITY): Payer: Self-pay | Admitting: Psychiatry

## 2016-06-24 NOTE — Progress Notes (Deleted)
BH MD/PA/NP OP Progress Note  06/24/2016 7:49 AM Cody Ruiz  MRN:  [id number]  Chief Complaint:  Subjective:  *** HPI: *** Visit Diagnosis: No diagnosis found.  Past Psychiatric History: ***  Past Medical History:  Past Medical History:  Diagnosis Date  . Diabetes mellitus without complication (HCC)   . Hypertension     Past Surgical History:  Procedure Laterality Date  . ADENOIDECTOMY    . BACK SURGERY      Family Psychiatric History: ***  Family History:  Family History  Problem Relation Age of Onset  . Hypertension Mother   . Diabetes Mother   . Hypertension Father   . Diabetes Paternal Grandmother     Social History:  Social History   Social History  . Marital status: Married    Spouse name: N/A  . Number of children: N/A  . Years of education: N/A   Social History Main Topics  . Smoking status: Current Every Day Smoker    Types: Cigars  . Smokeless tobacco: Never Used     Comment: cigar-1 per day  . Alcohol use No  . Drug use: Yes    Frequency: 7.0 times per week    Types: Marijuana  . Sexual activity: Yes   Other Topics Concern  . Not on file   Social History Narrative  . No narrative on file    Allergies: No Known Allergies  Metabolic Disorder Labs: Lab Results  Component Value Date   HGBA1C 10.2 (H) 05/13/2016   No results found for: PROLACTIN Lab Results  Component Value Date   CHOL 206 (H) 05/13/2016   TRIG 217.0 (H) 05/13/2016   HDL 43.20 05/13/2016   CHOLHDL 5 05/13/2016   VLDL 43.4 (H) 05/13/2016     Current Medications: Current Outpatient Prescriptions  Medication Sig Dispense Refill  . acetaminophen (TYLENOL) 500 MG tablet Take 1,500 mg by mouth every 6 (six) hours as needed for mild pain.    Marland Kitchen amLODipine-benazepril (LOTREL) 10-40 MG capsule Take 1 capsule by mouth daily. 30 capsule 0  . atorvastatin (LIPITOR) 10 MG tablet Take 1 tablet (10 mg total) by mouth daily. 30 tablet 2  . carvedilol (COREG) 12.5 MG tablet  Take 12.5 mg by mouth 2 (two) times daily with a meal.    . EASY TOUCH LANCETS 30G MISC 1 each by Does not apply route 3 (three) times daily. 300 each 2  . FLUoxetine (PROZAC) 10 MG capsule Take 1 capsule (10 mg total) by mouth daily. Take 10 mg daily for 3 days, then increase to 20 mg daily 60 capsule 1  . gabapentin (NEURONTIN) 300 MG capsule Take 600 mg by mouth 3 (three) times daily.     . insulin aspart (NOVOLOG) 100 UNIT/ML FlexPen Inject 10 Units into the skin 3 (three) times daily with meals. 15 mL 0  . Insulin Detemir (LEVEMIR) 100 UNIT/ML Pen Inject 25 Units into the skin daily at 10 pm. 15 mL 0  . insulin detemir (LEVEMIR) 100 unit/ml SOLN Inject 50 Units into the skin at bedtime.    . meloxicam (MOBIC) 15 MG tablet     . methocarbamol (ROBAXIN) 750 MG tablet Take 750 mg by mouth 3 (three) times daily.      No current facility-administered medications for this visit.     Neurologic: Headache: {BHH YES OR NO:22294} Seizure: {BHH YES OR NO:22294} Paresthesias: {BHH YES OR NO:22294}  Musculoskeletal: Strength & Muscle Tone: {desc; muscle tone:32375} Gait & Station: {PE GAIT ED  ZOXW:96045}ATL:22525} Patient leans: {Patient Leans:21022755}  Psychiatric Specialty Exam: ROS  There were no vitals taken for this visit.There is no height or weight on file to calculate BMI.  General Appearance: {Appearance:22683}  Eye Contact:  {BHH EYE CONTACT:22684}  Speech:  {Speech:22685}  Volume:  {Volume (PAA):22686}  Mood:  {BHH MOOD:22306}  Affect:  {Affect (PAA):22687}  Thought Process:  {Thought Process (PAA):22688}  Orientation:  {BHH ORIENTATION (PAA):22689}  Thought Content: {Thought Content:22690}   Suicidal Thoughts:  {ST/HT (PAA):22692}  Homicidal Thoughts:  {ST/HT (PAA):22692}  Memory:  {BHH MEMORY:22881}  Judgement:  {Judgement (PAA):22694}  Insight:  {Insight (PAA):22695}  Psychomotor Activity:  {Psychomotor (PAA):22696}  Concentration:  {Concentration:21399}  Recall:  {BHH  GOOD/FAIR/POOR:22877}  Fund of Knowledge: {BHH GOOD/FAIR/POOR:22877}  Language: {BHH GOOD/FAIR/POOR:22877}  Akathisia:  {BHH YES OR NO:22294}  Handed:  {Handed:22697}  AIMS (if indicated):  ***  Assets:  {Assets (PAA):22698}  ADL's:  {BHH WUJ'W:11914}ADL'S:22290}  Cognition: {chl bhh cognition:304700322}  Sleep:  ***     Treatment Plan Summary:{CHL AMB BH MD [place] NWGN:[id number]}PLAN:[phone]}   Burnard LeighAlexander Arya Eksir, MD 06/24/2016, 7:49 AM

## 2017-04-14 ENCOUNTER — Other Ambulatory Visit: Payer: Self-pay

## 2017-04-14 ENCOUNTER — Encounter (HOSPITAL_COMMUNITY): Payer: Self-pay

## 2017-04-14 ENCOUNTER — Emergency Department (HOSPITAL_COMMUNITY)
Admission: EM | Admit: 2017-04-14 | Discharge: 2017-04-15 | Disposition: A | Discharge status: Home / Self Care | Payer: 59 | Attending: Emergency Medicine | Admitting: Emergency Medicine

## 2017-04-14 ENCOUNTER — Emergency Department (HOSPITAL_COMMUNITY): Payer: 59

## 2017-04-14 DIAGNOSIS — R531 Weakness: Secondary | ICD-10-CM | POA: Insufficient documentation

## 2017-04-14 DIAGNOSIS — Z79899 Other long term (current) drug therapy: Secondary | ICD-10-CM | POA: Diagnosis not present

## 2017-04-14 DIAGNOSIS — R079 Chest pain, unspecified: Secondary | ICD-10-CM | POA: Diagnosis not present

## 2017-04-14 DIAGNOSIS — I160 Hypertensive urgency: Secondary | ICD-10-CM | POA: Insufficient documentation

## 2017-04-14 DIAGNOSIS — F17290 Nicotine dependence, other tobacco product, uncomplicated: Secondary | ICD-10-CM | POA: Diagnosis not present

## 2017-04-14 DIAGNOSIS — F1210 Cannabis abuse, uncomplicated: Secondary | ICD-10-CM | POA: Insufficient documentation

## 2017-04-14 DIAGNOSIS — E119 Type 2 diabetes mellitus without complications: Secondary | ICD-10-CM | POA: Diagnosis not present

## 2017-04-14 DIAGNOSIS — R55 Syncope and collapse: Secondary | ICD-10-CM | POA: Diagnosis not present

## 2017-04-14 DIAGNOSIS — Z794 Long term (current) use of insulin: Secondary | ICD-10-CM | POA: Diagnosis not present

## 2017-04-14 DIAGNOSIS — I10 Essential (primary) hypertension: Secondary | ICD-10-CM | POA: Diagnosis present

## 2017-04-14 LAB — BASIC METABOLIC PANEL
ANION GAP: 10 (ref 5–15)
BUN: 13 mg/dL (ref 6–20)
CHLORIDE: 96 mmol/L — AB (ref 101–111)
CO2: 24 mmol/L (ref 22–32)
Calcium: 8.6 mg/dL — ABNORMAL LOW (ref 8.9–10.3)
Creatinine, Ser: 0.86 mg/dL (ref 0.61–1.24)
GFR calc Af Amer: >60 mL/min (ref >60)
GLUCOSE: 154 mg/dL — AB (ref 65–99)
POTASSIUM: 4 mmol/L (ref 3.5–5.1)
Sodium: 130 mmol/L — ABNORMAL LOW (ref 135–145)

## 2017-04-14 LAB — CBC
HEMATOCRIT: 37.7 % — AB (ref 39.0–52.0)
HEMOGLOBIN: 12.8 g/dL — AB (ref 13.0–17.0)
MCH: 28.5 pg (ref 26.0–34.0)
MCHC: 34 g/dL (ref 30.0–36.0)
MCV: 84 fL (ref 78.0–100.0)
Platelets: 293 10*3/uL (ref 150–400)
RBC: 4.49 MIL/uL (ref 4.22–5.81)
RDW: 13.7 % (ref 11.5–15.5)
WBC: 11.7 10*3/uL — ABNORMAL HIGH (ref 4.0–10.5)

## 2017-04-14 LAB — I-STAT TROPONIN, ED: Troponin i, poc: 0 ng/mL (ref 0.00–0.08)

## 2017-04-14 NOTE — ED Provider Notes (Signed)
[facility] Provider Note   CSN: [id number] Arrival date & time: 04/14/17  2304     History   Chief Complaint Chief Complaint  Patient presents with  . Hypertension  . Chest Pain    HPI Cody Ruiz is a 21 y.o. male.  The history is provided by the patient and the spouse.  Hypertension  This is a new problem. The problem occurs constantly. The problem has been gradually improving. Associated symptoms include chest pain. Pertinent negatives include no abdominal pain, no headaches and no shortness of breath. Nothing aggravates the symptoms. Nothing relieves the symptoms.  Chest Pain   Pertinent negatives include no abdominal pain, no fever, no headaches, no shortness of breath and no vomiting.  His past medical history is significant for hypertension.  Pertinent negatives for past medical history include no seizures.  Patient reports that at approximately 2200 on December 25, he was lying in bed when he felt like he might pass out.  He felt generalized weakness and thought that his glucose may be elevated so he gave himself insulin.  He told his wife about his symptoms and she decided taken to the ER and then called 911 and was brought by EMS There was no focal weakness, there is no syncope, no seizures reported.  He just felt generalized weakness and he might pass out No severe headache reported. No visual loss He is now feeling improved He reports history of hypertension, but is not currently taking his medications. He does have his insulin home for his diabetes Denies history of MI, stroke, PE He admits to marijuana use earlier, but denies cocaine use Denies alcohol use He reports he is otherwise felt well during Christmas day  He also mentions brief episodes of chest pain that lasted a few seconds, sharp in nature and no other associated symptoms, denies shortness of breath denies cough. He reports the chest pain is  minimal  Past Medical History:  Diagnosis Date  . Diabetes mellitus without complication (HCC)   . Hypertension     Patient Active Problem List   Diagnosis Date Noted  . Type 1 diabetes mellitus with hyperglycemia (HCC) 05/13/2016  . Uncontrolled hypertension 05/13/2016  . Chronic midline low back pain with right-sided sciatica 05/13/2016  . Severe obesity (BMI 35.0-35.9 with comorbidity) (HCC) 07/04/2014  . Diabetes type 2, uncontrolled (HCC) 07/04/2014  . Diabetes mellitus type 2, insulin dependent (HCC) 07/04/2014  . Tobacco use 07/04/2014    Past Surgical History:  Procedure Laterality Date  . ADENOIDECTOMY    . BACK SURGERY         Home Medications    Prior to Admission medications   Medication Sig Start Date End Date Taking? Authorizing Provider  acetaminophen (TYLENOL) 500 MG tablet Take 1,500 mg by mouth every 6 (six) hours as needed for mild pain.    [provider]  amLODipine-benazepril (LOTREL) 10-40 MG capsule Take 1 capsule by mouth daily. 05/14/16   Lorre MunroeBaity, Regina W, NP  atorvastatin (LIPITOR) 10 MG tablet Take 1 tablet (10 mg total) by mouth daily. 05/14/16   Lorre MunroeBaity, Regina W, NP  carvedilol (COREG) 12.5 MG tablet Take 12.5 mg by mouth 2 (two) times daily with a meal.    [provider]  EASY TOUCH LANCETS 30G MISC 1 each by Does not apply route 3 (three) times daily. 05/20/16   Lorre MunroeBaity, Regina W, NP  FLUoxetine (PROZAC) 10 MG capsule Take 1 capsule (10 mg total) by mouth  daily. Take 10 mg daily for 3 days, then increase to 20 mg daily 06/10/16   Burnard LeighEksir, Alexander Arya, MD  gabapentin (NEURONTIN) 300 MG capsule Take 600 mg by mouth 3 (three) times daily.  04/23/16   [provider]  insulin aspart (NOVOLOG) 100 UNIT/ML FlexPen Inject 10 Units into the skin 3 (three) times daily with meals. 05/16/16   Lorre MunroeBaity, Regina W, NP  Insulin Detemir (LEVEMIR) 100 UNIT/ML Pen Inject 25 Units into the skin daily at 10 pm. 05/14/16   Lorre MunroeBaity, Regina W, NP  insulin  detemir (LEVEMIR) 100 unit/ml SOLN Inject 50 Units into the skin at bedtime.    [provider]  meloxicam (MOBIC) 15 MG tablet  04/23/16   [provider]  methocarbamol (ROBAXIN) 750 MG tablet Take 750 mg by mouth 3 (three) times daily.  05/01/16   [provider]    Family History Family History  Problem Relation Age of Onset  . Hypertension Mother   . Diabetes Mother   . Hypertension Father   . Diabetes Paternal Grandmother     Social History Social History   Tobacco Use  . Smoking status: Current Every Day Smoker    Packs/day: 0.00    Types: Cigars  . Smokeless tobacco: Never Used  . Tobacco comment: cigar-1 per day  Substance Use Topics  . Alcohol use: No  . Drug use: Yes    Frequency: 7.0 times per week    Types: Marijuana    Comment: every day     Allergies   Patient has no known allergies.   Review of Systems Review of Systems  Constitutional: Negative for fever.  Respiratory: Negative for shortness of breath.   Cardiovascular: Positive for chest pain.  Gastrointestinal: Negative for abdominal pain and vomiting.  Neurological: Negative for seizures, syncope, speech difficulty and headaches.  All other systems reviewed and are negative.    Physical Exam Updated Vital Signs BP (!) 142/77   Pulse 75   Temp 98.3 F (36.8 C) (Oral)   Resp 16   Ht 1.803 m (5\' 11" )   Wt 108.9 kg (240 lb)   SpO2 98%   BMI 33.47 kg/m   Physical Exam  CONSTITUTIONAL: Well developed/well nourished HEAD: Normocephalic/atraumatic EYES: EOMI/PERRL, no nystagmus, no visual field deficit  no ptosis ENMT: Mucous membranes moist NECK: supple no meningeal signs, no bruits CV: S1/S2 noted, no murmurs/rubs/gallops noted LUNGS: Lungs are clear to auscultation bilaterally, no apparent distress ABDOMEN: soft, nontender, no rebound or guarding GU:no cva tenderness NEURO:Awake/alert, face symmetric, no arm or leg drift is noted Equal 5/5 strength with  shoulder abduction, elbow flex/extension, wrist flex/extension in upper extremities and equal hand grips bilaterally Cranial nerves 3/4/5/6/10/27/08/11/12 tested and intact No past pointing Sensation to light touch intact in all extremities EXTREMITIES: pulses normal, full ROM, right leg in a walking boot, patient has previous ankle sprain SKIN: warm, color normal PSYCH: no abnormalities of mood noted  ED Treatments / Results  Labs (all labs ordered are listed, but only abnormal results are displayed) Labs Reviewed  BASIC METABOLIC PANEL - Abnormal; Notable for the following components:      Result Value   Sodium 130 (*)    Chloride 96 (*)    Glucose, Bld 154 (*)    Calcium 8.6 (*)    All other components within normal limits  CBC - Abnormal; Notable for the following components:   WBC 11.7 (*)    Hemoglobin 12.8 (*)    HCT  37.7 (*)    All other components within normal limits  RAPID URINE DRUG SCREEN, HOSP PERFORMED - Abnormal; Notable for the following components:   Tetrahydrocannabinol POSITIVE (*)    All other components within normal limits  I-STAT TROPONIN, ED    EKG  EKG Interpretation  Date/Time:  Tuesday April 14 2017 23:07:56 EST Ventricular Rate:  89 PR Interval:    QRS Duration: 78 QT Interval:  333 QTC Calculation: 406 R Axis:   58 Text Interpretation:  Sinus rhythm Borderline T abnormalities, inferior leads No previous ECGs available Confirmed by Zadie Rhine ([id number]) on 04/14/2017 11:27:06 PM       Radiology Dg Chest 2 View  Result Date: 04/15/2017 CLINICAL DATA:  21 y/o  M; left upper chest pain.  Hypertension. EXAM: CHEST  2 VIEW COMPARISON:  None. FINDINGS: The heart size and mediastinal contours are within normal limits. Both lungs are clear. The visualized skeletal structures are unremarkable. IMPRESSION: No active cardiopulmonary disease. Electronically Signed   By: Mitzi Hansen M.D.   On: 04/15/2017 00:05     Procedures Procedures (including critical care time)  Medications Ordered in ED Medications - No data to display   Initial Impression / Assessment and Plan / ED Course  I have reviewed the triage vital signs and the nursing notes.  Pertinent labs & imaging results that were available during my care of the patient were reviewed by me and considered in my medical decision making (see chart for details).    11:54 PM Patient well-appearing, no distress noted, no signs of acute stroke EMS reported a blood pressure of 300, but here his blood pressures of all been below 200 without any intervention He reports feeling that he may pass out, but never had syncope Imaging/labs pending at this time 12:27 AM Pt improved He is ambulatory Labs appropriate He is unsure what his initial glucose at home was prior to giving himself 15 units of insulin 1:49 AM Improved, ambulatory, no distress, requesting discharge home The cause of his symptoms, but could have been due to hyperglycemia as well as potentially just side effects of elevated blood pressure No signs of acute neurologic emergency, and there is no signs of acute cardiopulmonary emergency Has had no chest pain here in the emergency department   He requests short-term refill of his medications prior to discharge and will follow-up soon with his PCP Final Clinical Impressions(s) / ED Diagnoses   Final diagnoses:  Hypertensive urgency  Near syncope    ED Discharge Orders        Ordered    carvedilol (COREG) 12.5 MG tablet  2 times daily with meals     04/15/17 0148    amLODipine-benazepril (LOTREL) 10-40 MG capsule  Daily     04/15/17 0148       Zadie Rhine, MD 04/15/17 0150

## 2017-04-14 NOTE — ED Triage Notes (Signed)
Per EMS, pt was in route to the hospital via pov for weakness and began shaking and thought his cbg was low and called ems. Pt found to be hypertensive 300/140. Most recent BP 240/120, HR 80-110 sinus, CBG 175, RR 18, Sat 100% RA. Alert and oriented x 4. Also complains of dizziness. Noncompliant with htn meds.

## 2017-04-15 LAB — RAPID URINE DRUG SCREEN, HOSP PERFORMED
Amphetamines: NOT DETECTED
BARBITURATES: NOT DETECTED
Benzodiazepines: NOT DETECTED
Cocaine: NOT DETECTED
Opiates: NOT DETECTED
TETRAHYDROCANNABINOL: POSITIVE — AB

## 2017-04-15 MED ORDER — AMLODIPINE BESY-BENAZEPRIL HCL 10-40 MG PO CAPS: 1.0000 | ORAL_CAPSULE | Freq: Every day | ORAL | 0 refills | Status: DC

## 2017-04-15 MED ORDER — CARVEDILOL 12.5 MG PO TABS: 12.5000 mg | ORAL_TABLET | Freq: Two times a day (BID) | ORAL | 0 refills | Status: DC

## 2017-04-15 NOTE — Discharge Instructions (Signed)

## 2017-04-20 ENCOUNTER — Encounter (HOSPITAL_COMMUNITY): Payer: Self-pay | Admitting: Emergency Medicine

## 2017-04-20 ENCOUNTER — Emergency Department (HOSPITAL_COMMUNITY): Payer: 59

## 2017-04-20 ENCOUNTER — Other Ambulatory Visit: Payer: Self-pay

## 2017-04-20 DIAGNOSIS — R079 Chest pain, unspecified: Secondary | ICD-10-CM | POA: Diagnosis not present

## 2017-04-20 DIAGNOSIS — Z5321 Procedure and treatment not carried out due to patient leaving prior to being seen by health care provider: Secondary | ICD-10-CM | POA: Insufficient documentation

## 2017-04-20 LAB — BASIC METABOLIC PANEL
Anion gap: 7 (ref 5–15)
BUN: 11 mg/dL (ref 6–20)
CHLORIDE: 100 mmol/L — AB (ref 101–111)
CO2: 27 mmol/L (ref 22–32)
Calcium: 8.8 mg/dL — ABNORMAL LOW (ref 8.9–10.3)
Creatinine, Ser: 0.8 mg/dL (ref 0.61–1.24)
GFR calc Af Amer: >60 mL/min (ref >60)
GFR calc non Af Amer: >60 mL/min (ref >60)
GLUCOSE: 163 mg/dL — AB (ref 65–99)
POTASSIUM: 3.9 mmol/L (ref 3.5–5.1)
SODIUM: 134 mmol/L — AB (ref 135–145)

## 2017-04-20 LAB — CBC
HEMATOCRIT: 39.9 % (ref 39.0–52.0)
HEMOGLOBIN: 13.5 g/dL (ref 13.0–17.0)
MCH: 28.4 pg (ref 26.0–34.0)
MCHC: 33.8 g/dL (ref 30.0–36.0)
MCV: 84 fL (ref 78.0–100.0)
Platelets: 313 10*3/uL (ref 150–400)
RBC: 4.75 MIL/uL (ref 4.22–5.81)
RDW: 13.9 % (ref 11.5–15.5)
WBC: 12.5 10*3/uL — AB (ref 4.0–10.5)

## 2017-04-20 LAB — I-STAT TROPONIN, ED: Troponin i, poc: 0 ng/mL (ref 0.00–0.08)

## 2017-04-20 NOTE — ED Triage Notes (Signed)
Patient reports intermittent left chest pain with palpitations radiating to left upper arm and mild SOB onset last week , denies nausea or diaphoresis .

## 2017-04-21 ENCOUNTER — Emergency Department (HOSPITAL_COMMUNITY)
Admission: EM | Admit: 2017-04-21 | Discharge: 2017-04-21 | Discharge status: Against Medical Advice | Payer: 59 | Attending: Emergency Medicine | Admitting: Emergency Medicine

## 2017-04-21 NOTE — ED Notes (Signed)
Pt called for treatment room multiple times without answer, unable to locate in lobby, presumed to have left without being seen.

## 2017-04-22 ENCOUNTER — Telehealth: Payer: Self-pay

## 2017-04-22 NOTE — Telephone Encounter (Signed)
Copied from CRM [phone]#[id number]. Topic: General - Other >> Apr 22, 2017 10:13 AM Percival SpanishKennedy, Cheryl W wrote: Reason for CRM   pt is still having bp issues and I scheduled him for 04/28/17  and I think he needs a fup call back today or this week [phone]

## 2017-04-22 NOTE — Telephone Encounter (Signed)
I spoke with pt; pt seen Ed on 04/14/17 and has had CP on and off since seen 04/14/17. No CP, SOB,H/A or dizziness now. Pt did not take BP meds on 04/21/17 [place]; pt to get med today. Today BP 180/110. [facility]ched[facility] ED [facility]24[facility]17 [facility]. If pt condition changes or worsens prior to appt pt to go to ED. Pt voiced understanding. FYI to Pamala Hurry Baity NP.

## 2017-04-22 NOTE — Telephone Encounter (Signed)
Noted, agree with advice given 

## 2017-04-24 ENCOUNTER — Ambulatory Visit: Payer: 59 | Admitting: Internal Medicine

## 2017-04-24 ENCOUNTER — Encounter: Payer: Self-pay | Admitting: Internal Medicine

## 2017-04-24 VITALS — BP 226/110 | HR 121 | Temp 98.3°F | Wt 242.8 lb

## 2017-04-24 DIAGNOSIS — R0789 Other chest pain: Secondary | ICD-10-CM

## 2017-04-24 DIAGNOSIS — I10 Essential (primary) hypertension: Secondary | ICD-10-CM | POA: Diagnosis not present

## 2017-04-24 DIAGNOSIS — R55 Syncope and collapse: Secondary | ICD-10-CM | POA: Diagnosis not present

## 2017-04-24 MED ORDER — LABETALOL HCL 100 MG PO TABS: 100.0000 mg | ORAL_TABLET | Freq: Two times a day (BID) | ORAL | 2 refills | Status: DC

## 2017-04-24 MED ORDER — AMLODIPINE BESY-BENAZEPRIL HCL 10-40 MG PO CAPS
1.0000 | ORAL_CAPSULE | Freq: Every day | ORAL | 2 refills | Status: DC
Start: 2017-04-24 — End: 2017-06-17

## 2017-04-24 NOTE — Progress Notes (Signed)
Subjective:    Patient ID: Cody Ruiz, male    DOB: Aug 29, 1995, 22 y.o.   MRN: [id number]  HPI  Pt presents to the clinic today for ER follow up. He went to the ER 12/25 via EMS for chest pain and near syncope. His ECG did not show any acute findings. Chest xray was normal. Labs were unremarkable. His BP was noted to be elevated, so he was started on Benazepril and Carvedilol. He was discharged, and advised to follow up with PCP. He actually missed a few doses of his BP med and was monitoring it at home, it was 180/110. He has had intermittent chest pains that he describes as. He denies dizziness, visual changes or shortness of breath. His BP today is 226/110.  Review of Systems      Past Medical History:  Diagnosis Date  . Diabetes mellitus without complication (HCC)   . Hypertension     Current Outpatient Medications  Medication Sig Dispense Refill  . acetaminophen (TYLENOL) 500 MG tablet Take 500-1,000 mg by mouth every 6 (six) hours as needed (for pain).     Marland Kitchen amLODipine-benazepril (LOTREL) 10-40 MG capsule Take 1 capsule by mouth daily. 14 capsule 0  . carvedilol (COREG) 12.5 MG tablet Take 1 tablet (12.5 mg total) by mouth 2 (two) times daily with a meal. 28 tablet 0  . insulin aspart (NOVOLOG) 100 UNIT/ML FlexPen Inject 10 Units into the skin 3 (three) times daily with meals. (Patient not taking: Reported on 04/20/2017) 15 mL 0   No current facility-administered medications for this visit.     No Known Allergies  Family History  Problem Relation Age of Onset  . Hypertension Mother   . Diabetes Mother   . Hypertension Father   . Diabetes Paternal Grandmother     Social History   Socioeconomic History  . Marital status: Married    Spouse name: Not on file  . Number of children: Not on file  . Years of education: Not on file  . Highest education level: Not on file  Social Needs  . Financial resource strain: Not on file  . Food insecurity - worry: Not on  file  . Food insecurity - inability: Not on file  . Transportation needs - medical: Not on file  . Transportation needs - non-medical: Not on file  Occupational History  . Not on file  Tobacco Use  . Smoking status: Current Every Day Smoker    Packs/day: 0.00    Types: Cigars  . Smokeless tobacco: Never Used  . Tobacco comment: cigar-1 per day  Substance and Sexual Activity  . Alcohol use: Yes  . Drug use: Yes    Frequency: 7.0 times per week    Types: Marijuana    Comment: every day  . Sexual activity: Yes  Other Topics Concern  . Not on file  Social History Narrative  . Not on file     Constitutional: Denies fever, malaise, fatigue, headache or abrupt weight changes.  Respiratory: Denies difficulty breathing, shortness of breath, cough or sputum production.   Cardiovascular: Denies chest pain, chest tightness, palpitations or swelling in the hands or feet.  Neurological: Denies dizziness, difficulty with memory, difficulty with speech or problems with balance and coordination.   No other specific complaints in a complete review of systems (except as listed in HPI above).  Objective:   Physical Exam   BP (!) 226/110   Pulse (!) 121   Temp 98.3 F (36.8 C) (  Oral)   Wt 242 lb 12 oz (110.1 kg)   SpO2 98%   BMI 33.86 kg/m  Wt Readings from Last 3 Encounters:  04/24/17 242 lb 12 oz (110.1 kg)  04/20/17 245 lb (111.1 kg)  04/14/17 240 lb (108.9 kg)    General: Appears his stated age, obese in NAD.  Cardiovascular: Normal rate and rhythm. S1,S2 noted.  No murmur, rubs or gallops noted.  Pulmonary/Chest: Normal effort and positive vesicular breath sounds. No respiratory distress. No wheezes, rales or ronchi noted.  Neurological: Alert and oriented.    BMET    Component Value Date/Time   NA 134 (L) 04/20/2017 2153   K 3.9 04/20/2017 2153   CL 100 (L) 04/20/2017 2153   CO2 27 04/20/2017 2153   GLUCOSE 163 (H) 04/20/2017 2153   BUN 11 04/20/2017 2153    CREATININE 0.80 04/20/2017 2153   CALCIUM 8.8 (L) 04/20/2017 2153   GFRNONAA >60 04/20/2017 2153   GFRAA >60 04/20/2017 2153    Lipid Panel     Component Value Date/Time   CHOL 206 (H) 05/13/2016 0959   TRIG 217.0 (H) 05/13/2016 0959   HDL 43.20 05/13/2016 0959   CHOLHDL 5 05/13/2016 0959   VLDL 43.4 (H) 05/13/2016 0959    CBC    Component Value Date/Time   WBC 12.5 (H) 04/20/2017 2153   RBC 4.75 04/20/2017 2153   HGB 13.5 04/20/2017 2153   HCT 39.9 04/20/2017 2153   PLT 313 04/20/2017 2153   MCV 84.0 04/20/2017 2153   MCH 28.4 04/20/2017 2153   MCHC 33.8 04/20/2017 2153   RDW 13.9 04/20/2017 2153    Hgb A1C Lab Results  Component Value Date   HGBA1C 10.2 (H) 05/13/2016           Assessment & Plan:   ER Follow Up for Chest Pain, Near Syncope and HTN:  ER notes, labs and imaging reviewed. Continue Amlodipine-Benazepril Stop Carvedilol 12.5 mg BID eRx for Labetalol 100 mg BID 0.1 mg of Clonidine given PO today, BP recheck 180/88 Renal artery US ordered  Follow up with me in 2 weeks for BP follow up, ER precautions discussed Nicki ReaperBAITY, REGINA, NP

## 2017-04-25 ENCOUNTER — Other Ambulatory Visit: Payer: Self-pay | Admitting: Family Medicine

## 2017-04-25 DIAGNOSIS — I10 Essential (primary) hypertension: Secondary | ICD-10-CM

## 2017-04-25 MED ORDER — LABETALOL HCL 100 MG PO TABS: 100.0000 mg | ORAL_TABLET | Freq: Two times a day (BID) | ORAL | 2 refills | Status: AC

## 2017-04-27 ENCOUNTER — Encounter: Payer: Self-pay | Admitting: Internal Medicine

## 2017-04-27 ENCOUNTER — Telehealth: Payer: Self-pay

## 2017-04-27 NOTE — Telephone Encounter (Signed)
PLEASE NOTE: All timestamps contained within this report are represented as [place] Standard Time. CONFIDENTIALTY NOTICE: This fax transmission is intended only for the addressee. It contains information that is legally privileged, confidential or otherwise protected from use or disclosure. If you are not the intended recipient, you are strictly prohibited from reviewing, disclosing, copying using or disseminating any of this information or taking any action in reliance on or regarding this information. If you have received this fax in error, please notify us immediately by telephone so that we can arrange for its return to [place]. Phone: [phone], Toll-Free: [phone], Fax: [phone] Page: 1 of 2 Call Id: [id number] [facility] Night - Client TELEPHONE ADVICE RECORD [facility] Patient Name: Cody Ruiz Gender: Male DOB: [birth year] Age: 22 Y 9 M 5 D Return Phone Number: [phone] (Primary), [phone] (Secondary) Address: [place] [address] Client [facility] Night - Client Client Site [facility] - Night Physician Nicki Reaper - NP Contact Type Call Who Is Calling Patient / Member / Family / Caregiver Call Type Triage / Clinical Relationship To Patient Self Return Phone Number [phone] (Primary) Chief Complaint Prescription Refill or Medication Request (non symptomatic) Reason for Call Symptomatic / Request for Health Information Initial Comment Caller States he was supposed to have a script sent in but the pharmacy does not have it. Translation No Nurse Assessment Nurse: Lane Hacker, RN, Elvin So Date/Time (Eastern Time): 04/25/2017 8:53:38 AM Please select the assessment type ---RX called in but not at pharm Additional Documentation ---Caller states he was supposed to have a script for Labetolol 100 mg QD sent in but the pharmacy does not have it. He just took his other  meds this am, and unsure. Yesterday, BP 200/120 approx. Document the name of the medication. ---Labetolol 100 mg QD Pharmacy name and phone number. ---[facility] in [place] on [address] [phone] Has the office closed within the last 30 minutes? ---No Does the client directives allow for assistance with medications after hours? ---Yes Is there an on-call physician for the client? ---Yes Additional Documentation ---RN will page on call MD. Caller verbalized understanding. Guidelines Guideline Title Affirmed Question Affirmed Notes Nurse Date/Time (Eastern Time) Disp. Time Lamount Cohen Time) Disposition Final User 04/25/2017 8:58:06 AM Paged On Call back to [facility], [place], [place] 04/25/2017 9:25:19 AM Paged On Call back to [facility], [place], [place] 04/25/2017 9:30:26 AM Clinical Call Yes Lane Hacker, RN, Elvin So PLEASE NOTE: All timestamps contained within this report are represented as [place] Standard Time. CONFIDENTIALTY NOTICE: This fax transmission is intended only for the addressee. It contains information that is legally privileged, confidential or otherwise protected from use or disclosure. If you are not the intended recipient, you are strictly prohibited from reviewing, disclosing, copying using or disseminating any of this information or taking any action in reliance on or regarding this information. If you have received this fax in error, please notify us immediately by telephone so that we can arrange for its return to [place]. Phone: [phone], Toll-Free: [phone], Fax: [phone] Page: 2 of 2 Call Id: [id number] Paging DoctorName Phone DateTime Result/Outcome Message Type Notes Kerby Nora - MD [phone] 04/25/2017 8:58:06 AM Paged On Call Back to Call Center Doctor Paged [phone] Rubie Maid RN at Call center Kerby Nora - MD [phone] 04/25/2017 9:25:19 AM Paged On Call Back to Call Center Doctor Paged Call [phone] for Windy RN at Call  center [place], [place] - MD 04/25/2017 9:30:12  AM Spoke with On Call - General Message Result Dr. Ermalene SearingBedsole notified, and confirmed that it was sent in. She will resend now. Labetolol 100 mg BID. #60 - Caller notified and verbalized understanding.

## 2017-04-27 NOTE — Patient Instructions (Signed)

## 2017-04-28 ENCOUNTER — Inpatient Hospital Stay: Payer: Self-pay | Admitting: Internal Medicine

## 2017-04-29 ENCOUNTER — Encounter (HOSPITAL_COMMUNITY): Payer: Self-pay | Admitting: Emergency Medicine

## 2017-04-29 ENCOUNTER — Emergency Department (HOSPITAL_COMMUNITY): Payer: 59

## 2017-04-29 ENCOUNTER — Emergency Department (HOSPITAL_COMMUNITY)
Admission: EM | Admit: 2017-04-29 | Discharge: 2017-04-29 | Discharge status: Against Medical Advice | Payer: 59 | Attending: Emergency Medicine | Admitting: Emergency Medicine

## 2017-04-29 DIAGNOSIS — R079 Chest pain, unspecified: Secondary | ICD-10-CM | POA: Insufficient documentation

## 2017-04-29 DIAGNOSIS — Z5321 Procedure and treatment not carried out due to patient leaving prior to being seen by health care provider: Secondary | ICD-10-CM | POA: Diagnosis not present

## 2017-04-29 LAB — CBC
HEMATOCRIT: 38.7 % — AB (ref 39.0–52.0)
Hemoglobin: 13.1 g/dL (ref 13.0–17.0)
MCH: 28.4 pg (ref 26.0–34.0)
MCHC: 33.9 g/dL (ref 30.0–36.0)
MCV: 83.8 fL (ref 78.0–100.0)
Platelets: 325 10*3/uL (ref 150–400)
RBC: 4.62 MIL/uL (ref 4.22–5.81)
RDW: 13.2 % (ref 11.5–15.5)
WBC: 11.9 10*3/uL — AB (ref 4.0–10.5)

## 2017-04-29 LAB — BASIC METABOLIC PANEL
ANION GAP: 8 (ref 5–15)
BUN: 8 mg/dL (ref 6–20)
CALCIUM: 9.4 mg/dL (ref 8.9–10.3)
CO2: 28 mmol/L (ref 22–32)
Chloride: 100 mmol/L — ABNORMAL LOW (ref 101–111)
Creatinine, Ser: 0.86 mg/dL (ref 0.61–1.24)
GFR calc Af Amer: >60 mL/min (ref >60)
Glucose, Bld: 99 mg/dL (ref 65–99)
POTASSIUM: 3.7 mmol/L (ref 3.5–5.1)
SODIUM: 136 mmol/L (ref 135–145)

## 2017-04-29 LAB — I-STAT TROPONIN, ED: TROPONIN I, POC: 0 ng/mL (ref 0.00–0.08)

## 2017-04-29 NOTE — ED Notes (Signed)
Pt sts unable to wait longer will follow up with PCP or [facility] as needed. RN discussed return precautions and encouraged stay and follow up if unable too.

## 2017-04-29 NOTE — ED Triage Notes (Signed)
Pt to ED c/o L sided CP with radiation under L arm to L side of back and tingling down L arm since this morning. States he woke up and had it - intermittent "goes away for a few minutes and comes back for a few minutes." Pt denies SOB, no dizziness, no N/V. Skin warm/dry. Resp e/u. Patient states he was here about a week ago for same and they couldn't find anything wrong, "could have been a panic attack." Pt states this pain is more intense and concerned him enough to come back.

## 2017-05-06 ENCOUNTER — Ambulatory Visit (INDEPENDENT_AMBULATORY_CARE_PROVIDER_SITE_OTHER): Payer: 59

## 2017-05-06 DIAGNOSIS — I10 Essential (primary) hypertension: Secondary | ICD-10-CM

## 2017-06-17 ENCOUNTER — Other Ambulatory Visit: Payer: Self-pay

## 2017-06-17 DIAGNOSIS — I10 Essential (primary) hypertension: Secondary | ICD-10-CM

## 2017-06-17 MED ORDER — AMLODIPINE BESY-BENAZEPRIL HCL 10-40 MG PO CAPS: 1.0000 | ORAL_CAPSULE | Freq: Every day | ORAL | 0 refills | Status: AC

## 2018-07-28 IMAGING — DX DG CHEST 2V
2 series · 2 of 2 positions shown · non-contrast
Comparison: 04/20/2017

CLINICAL DATA: Left-sided chest pain. Vomiting for a couple of days

EXAM:
CHEST  2 VIEW

[chest pa]
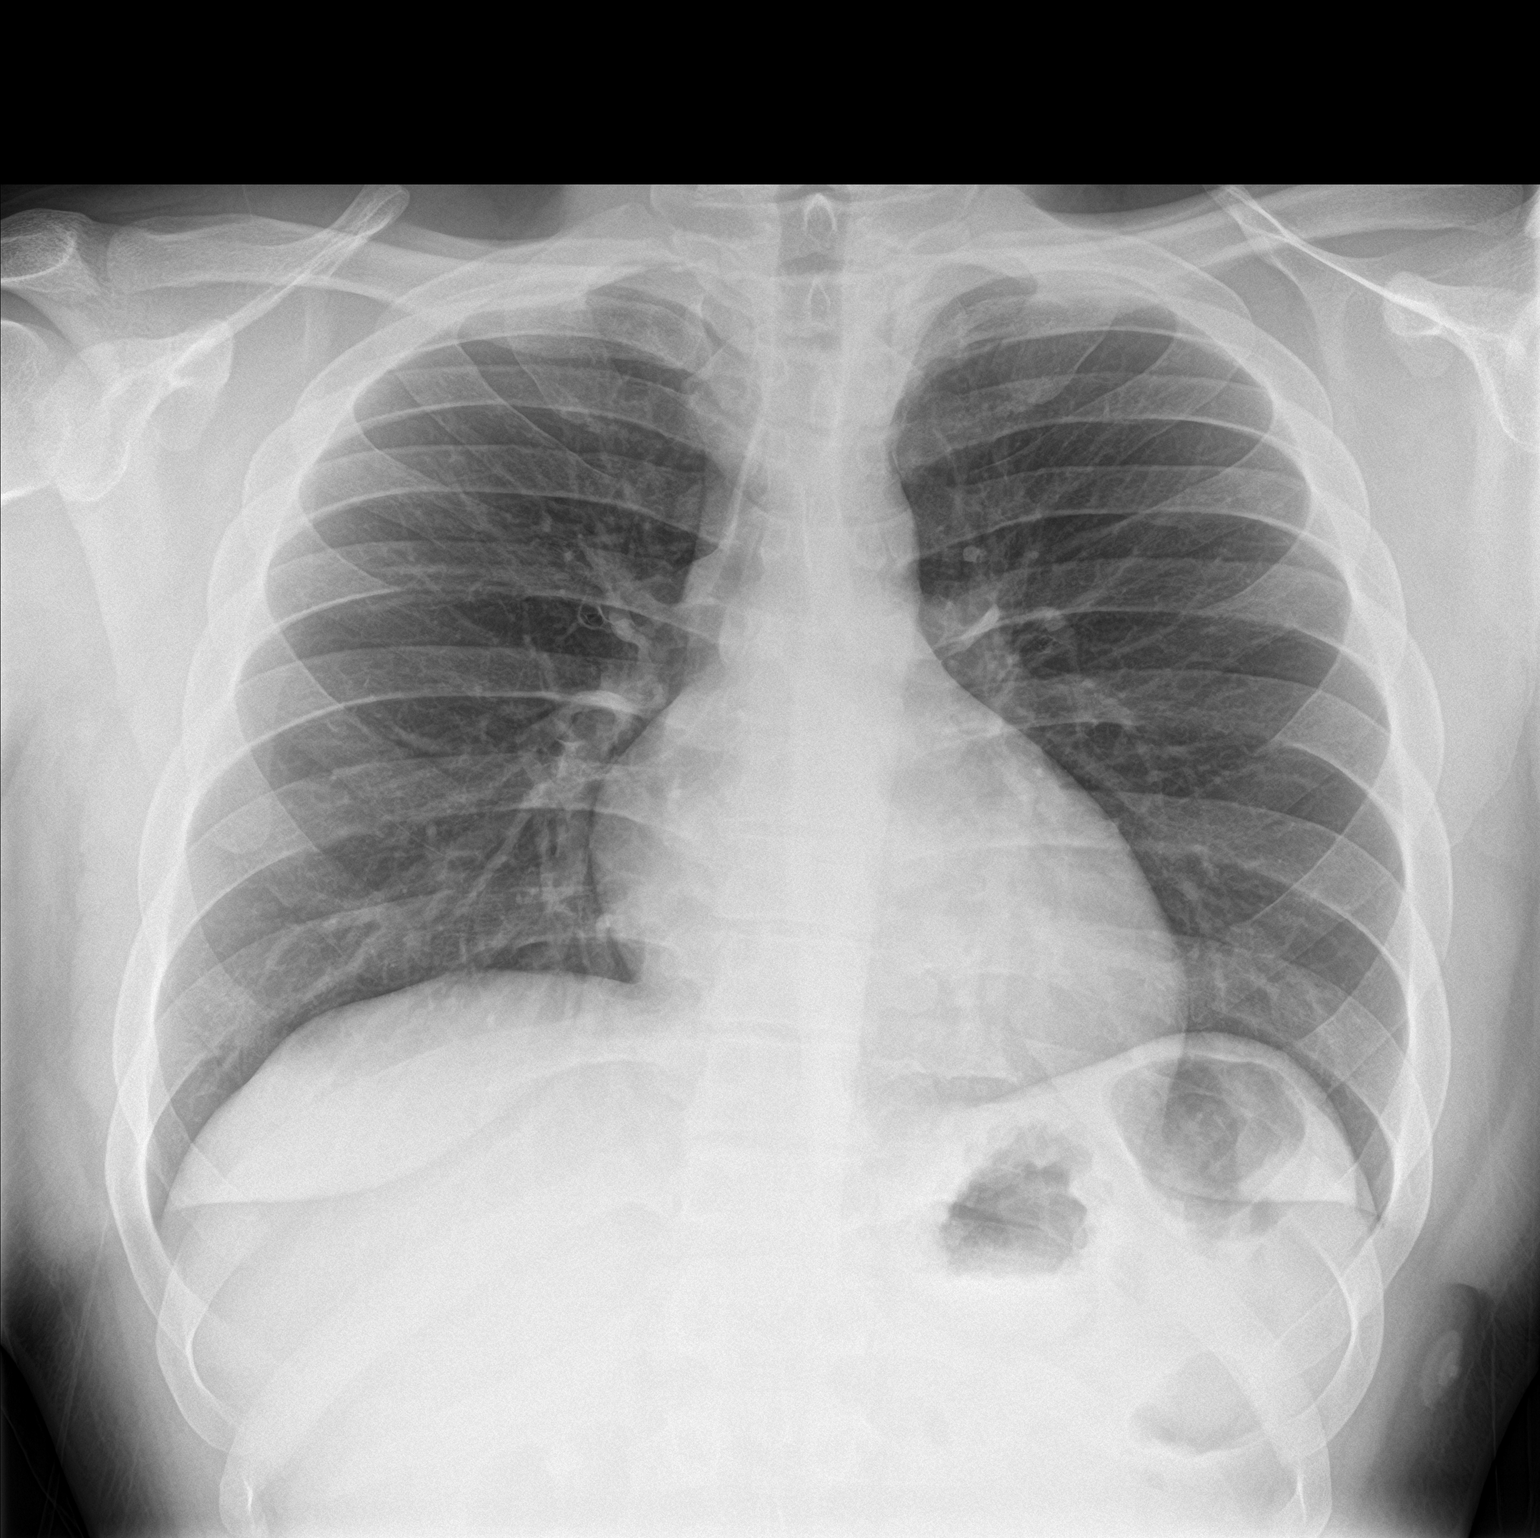

[chest lat]
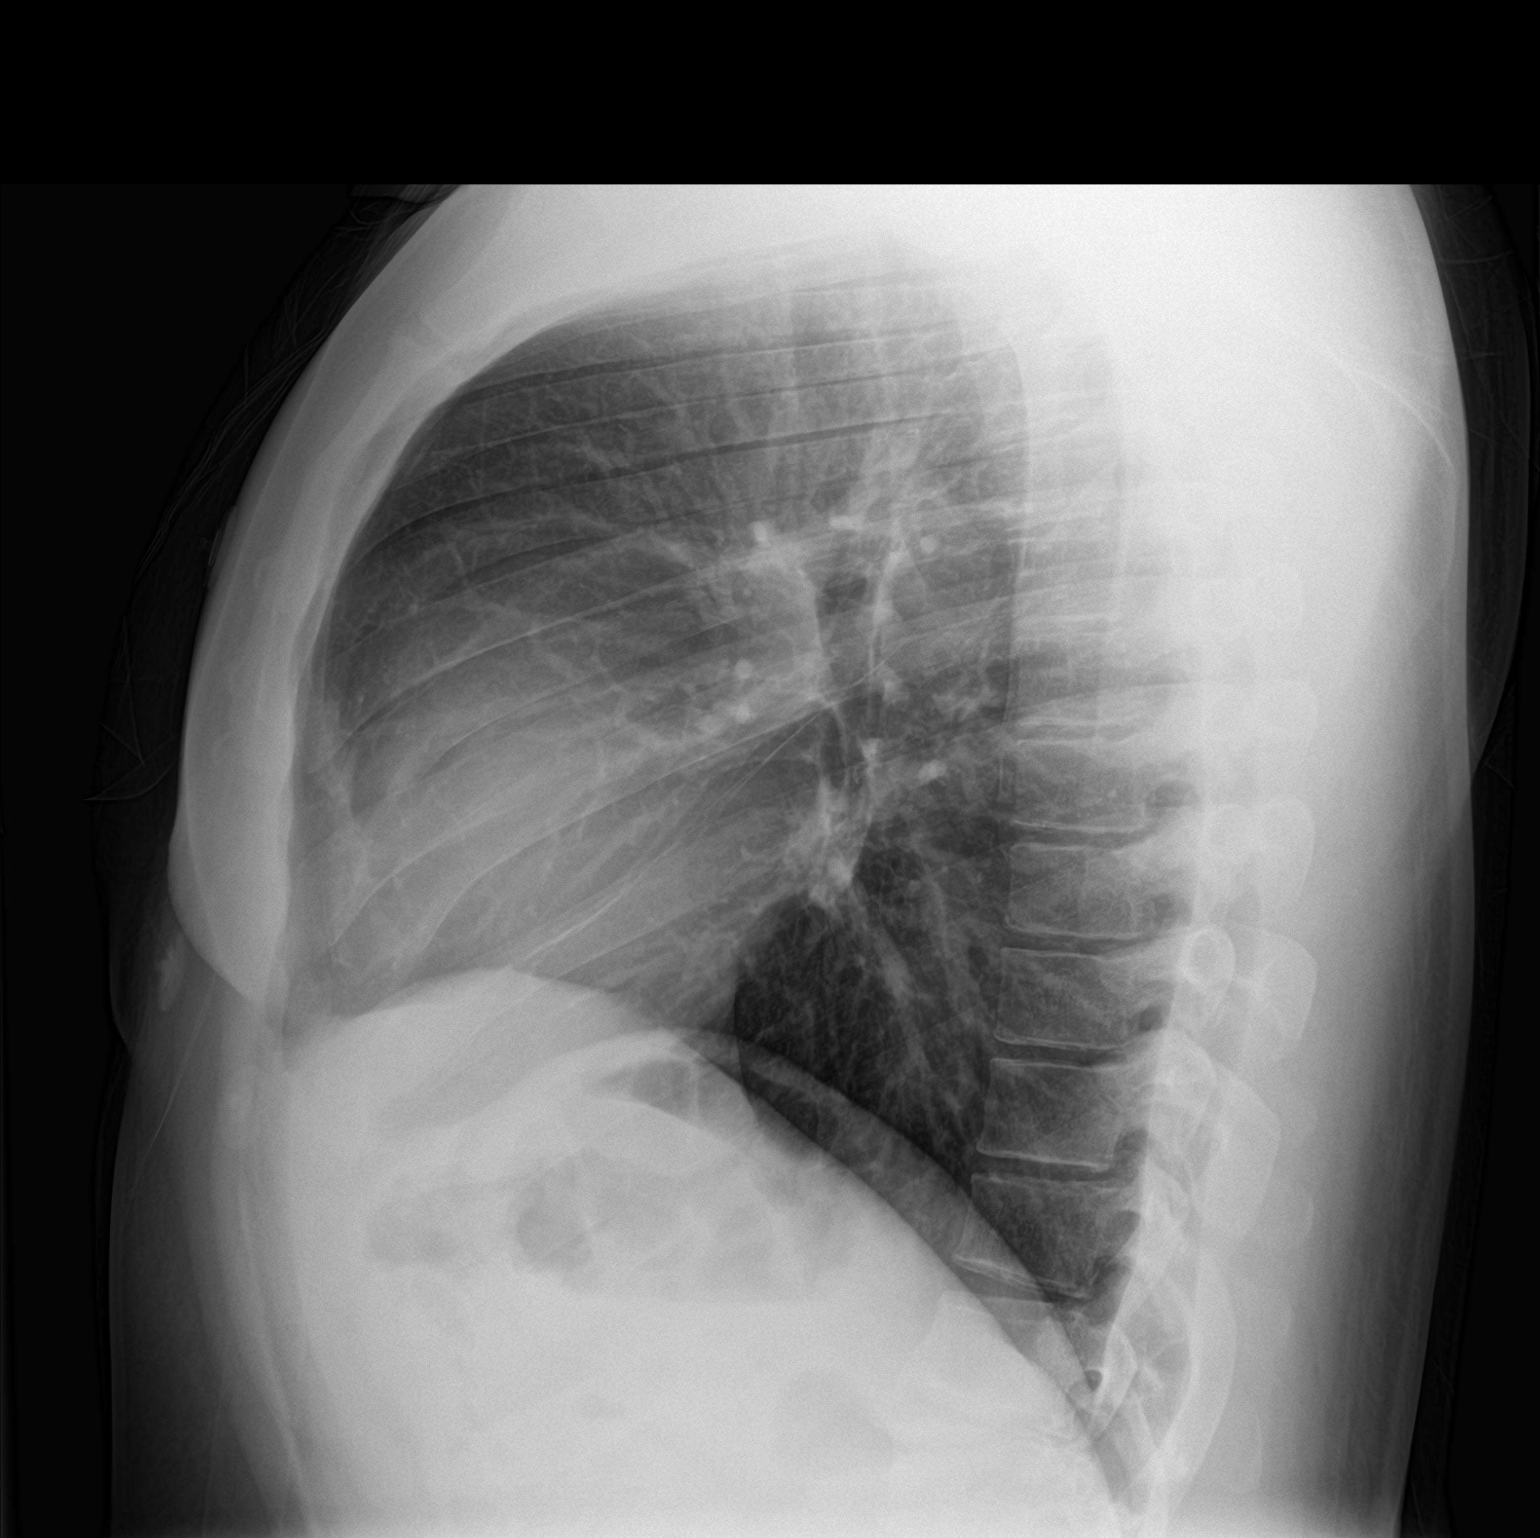

[2 of 2 positions shown; findings below may reference images not displayed]

FINDINGS: Normal heart size and mediastinal contours. No acute infiltrate or
edema. No effusion or pneumothorax. No acute osseous findings.
IMPRESSION: Negative chest.
